# Patient Record
Sex: Female | Born: 1975 | Race: White | Hispanic: Yes | Marital: Married | Smoking: Never smoker
Health system: Southern US, Community
[De-identification: ages and names within clinical notes are randomized; demographics above are authoritative.]

## PROBLEM LIST (undated history)

## (undated) DIAGNOSIS — D649 Anemia, unspecified: Secondary | ICD-10-CM

## (undated) DIAGNOSIS — C801 Malignant (primary) neoplasm, unspecified: Secondary | ICD-10-CM

## (undated) HISTORY — PX: TUBAL LIGATION: SHX77

## (undated) HISTORY — PX: NO PAST SURGERIES: SHX2092

---

## 2004-08-19 ENCOUNTER — Emergency Department: Payer: Self-pay | Admitting: Emergency Medicine

## 2004-10-31 ENCOUNTER — Emergency Department: Payer: Self-pay | Admitting: Emergency Medicine

## 2004-12-09 ENCOUNTER — Emergency Department: Payer: Self-pay | Admitting: Emergency Medicine

## 2005-03-29 ENCOUNTER — Emergency Department: Payer: Self-pay | Admitting: Emergency Medicine

## 2005-07-15 ENCOUNTER — Emergency Department: Payer: Self-pay | Admitting: Emergency Medicine

## 2005-07-16 ENCOUNTER — Ambulatory Visit: Payer: Self-pay | Admitting: Emergency Medicine

## 2006-05-11 ENCOUNTER — Inpatient Hospital Stay: Payer: Self-pay

## 2008-02-03 ENCOUNTER — Inpatient Hospital Stay: Payer: Self-pay

## 2008-03-14 ENCOUNTER — Ambulatory Visit: Payer: Self-pay | Admitting: Obstetrics and Gynecology

## 2008-03-21 ENCOUNTER — Ambulatory Visit: Payer: Self-pay | Admitting: Obstetrics and Gynecology

## 2011-07-05 ENCOUNTER — Ambulatory Visit: Payer: Self-pay | Admitting: Obstetrics and Gynecology

## 2011-07-07 ENCOUNTER — Ambulatory Visit: Payer: Self-pay | Admitting: Obstetrics and Gynecology

## 2016-04-16 ENCOUNTER — Ambulatory Visit
Admission: EM | Admit: 2016-04-16 | Discharge: 2016-04-16 | Disposition: A | Discharge status: Home / Self Care | Payer: BLUE CROSS/BLUE SHIELD | Attending: Family Medicine | Admitting: Family Medicine

## 2016-04-16 ENCOUNTER — Encounter: Payer: Self-pay | Admitting: Emergency Medicine

## 2016-04-16 DIAGNOSIS — J020 Streptococcal pharyngitis: Secondary | ICD-10-CM

## 2016-04-16 LAB — RAPID STREP SCREEN (MED CTR MEBANE ONLY): STREPTOCOCCUS, GROUP A SCREEN (DIRECT): POSITIVE — AB

## 2016-04-16 MED ORDER — AMOXICILLIN-POT CLAVULANATE 875-125 MG PO TABS: 1.0000 | ORAL_TABLET | Freq: Two times a day (BID) | ORAL | Status: DC

## 2016-04-16 NOTE — Discharge Instructions (Signed)
Faringitis (Pharyngitis) La faringitis ocurre cuando la faringe presenta enrojecimiento, dolor e hinchazn (inflamacin).  CAUSAS  Normalmente, la faringitis se debe a una infeccin. Generalmente, estas infecciones ocurren debido a virus (viral) y se presentan cuando las personas se resfran. Sin embargo, a Curator faringitis es provocada por bacterias (bacteriana). Las alergias tambin pueden ser una causa de la faringitis. La faringitis viral se puede contagiar de Ardelia Mems persona a otra al toser, estornudar y compartir objetos o utensilios personales (tazas, tenedores, cucharas, cepillos de diente). La faringitis bacteriana se puede contagiar de Ardelia Mems persona a otra a travs de un contacto ms ntimo, como besar.  [place] sntomas de la faringitis incluyen los siguientes:   Dolor de Investment banker, operational.  Cansancio (fatiga).  Fiebre no muy elevada.  Dolor de [place].  Dolores musculares y en las articulaciones.  Erupciones cutneas  Ganglios linfticos hinchados.  Una pelcula parecida a las placas en la garganta o las amgdalas (frecuente con la faringitis bacteriana). DIAGNSTICO  El mdico le har preguntas sobre la enfermedad y sus sntomas. Normalmente, todo lo que se necesita para diagnosticar una faringitis son sus antecedentes mdicos y un examen fsico. A veces se realiza una prueba rpida para estreptococos. Tambin es posible que se realicen otros anlisis de laboratorio, segn la posible causa.  TRATAMIENTO  La faringitis viral normalmente mejorar en un plazo de 3 a 4das sin medicamentos. La faringitis bacteriana se trata con medicamentos que [employer] grmenes (antibiticos).  INSTRUCCIONES PARA EL CUIDADO EN EL HOGAR   Beba gran cantidad de lquido para mantener la orina de tono claro o color amarillo plido.  Tome solo medicamentos de venta libre o recetados, segn las indicaciones del mdico.  Si le receta antibiticos, asegrese de terminarlos, incluso si comienza  a Sports administrator.  No tome aspirina.  Descanse lo suficiente.  Hgase grgaras con 8onzas (287m) de agua con sal (cucharadita de sal por litro de agua) cada 1 o 2horas para cEngineer, structural  Puede usar pastillas (si no corre riesgo de aHydrologist o aerosoles para cEngineer, structural SOLICITE ATENCIN MDICA SI:   Tiene bultos grandes y dolorosos en el cuello.  Tiene una erupcin cutnea.  Cuando tose elimina una expectoracin verde, amarillo amarronado o con [place] SOLICITE ATENCIN MDICA DE INMEDIATO SI:   El cuello se pone rgido.  Comienza a babear o no puede tragar lquidos.  Vomita o no puede retener los mCMS Energy Corporationlquidos.  Siente un dolor intenso que no se alivia con los medicamentos recomendados.  Tiene dificultades para respirar (y no debido a la nariz tapada). ASEGRESE DE QUE:   Comprende estas instrucciones.  Controlar su afeccin.  Recibir ayuda de inmediato si no mejora o si empeora.   Esta informacin no tiene cMarine scientistel consejo del mdico. Asegrese de hacerle al mdico cualquier pregunta que tenga.   Document Released: 07/06/2005 Document Revised: 07/17/2013 Elsevier Interactive Patient Education 2016 EJunction Cityrpida para estreptococos (Rapid Strep Test) La faringitis estreptoccica es una infeccin bacteriana causada por la especie de bacterias Streptococcus pyogenes. La prueba rpida para estreptococos es la forma ms veloz de comprobar si estas bacterias son las causantes del dolor de gInvestment banker, operational La prueba puede realizarse en el consultorio del mdico. GM[place] los resultados estn listos en el trmino de 10 a 230mutos. Pueden hacerle este estudio si tiene sntomas de faPrint production plannerEstos incluyen los siguientes:   Garganta roja con manchas amarillas o blancas.  Hinchazn y doSocial research officer, governmente  cuello.  Cristy Hilts.  Prdida del apetito.  Problemas para respirar o  tragar.  Erupcin.  Deshidratacin. Esta prueba requiere que se tome una muestra de las secreciones de la parte posterior de la garganta y las [place]. El mdico puede bajarle la lengua con un bajalenguas y usar un hisopo para tomar la [place],  y, al [employer], tomar una segunda muestra que puede utilizarse para un cultivo de las secreciones de la garganta. En un cultivo, la [place] se [place] con una sustancia que promueve el crecimiento de las bacterias. Toma ms tiempo [employer] del cultivo de las secreciones de la garganta, [place] son ms exactos y [employer] de la prueba rpida para estreptococos o Soil scientist que esos resultados estaban equivocados. RESULTADOS  Es su responsabilidad retirar el resultado del [place]. Consulte en el laboratorio o en el departamento en el que fue realizado el estudio cundo y cmo podr [employer]. Comunquese con el mdico si tiene [employer].  Los resultados de la prueba rpida para estreptococos sern negativos o positivos.  Significado de los [employer] del anlisis Si el resultado de la prueba rpida para estreptococos es negativo, significa que:   Es probable que no Librarian, academic.  Es posible que un virus est causando el dolor de Investment banker, operational. El mdico puede hacer un cultivo de las secreciones de la garganta para confirmar los [place] de la prueba rpida para estreptococos. Este cultivo tambin puede identificar las diferentes cepas de las bacterias estreptoccicas. Significado de [employer] positivos del anlisis Si el resultado de la prueba rpida para estreptococos es positivo, significa que:  Es probable que Librarian, academic.  Tal vez tenga que tomar antibiticos. El mdico puede hacer un cultivo de las secreciones de la garganta para confirmar los [place] de la prueba rpida para estreptococos. Generalmente, la faringitis estreptoccica  requiere un tratamiento con antibiticos.    Esta informacin no tiene Marine scientist el consejo del mdico. Asegrese de hacerle al mdico cualquier pregunta que tenga.   Document Released: 09/26/2005 Document Revised: 10/17/2014 Elsevier Interactive Patient Education [employer].

## 2016-04-16 NOTE — ED Provider Notes (Signed)
CSN: [id number]     Arrival date & time 04/16/16  3149 History   First MD Initiated Contact with Patient 04/16/16 1016     Chief Complaint  Patient presents with  . Sore Throat   (Consider location/radiation/quality/duration/timing/severity/associated sxs/prior Treatment) HPI  This a 40 year old Hispanic female presents with a 2 day history of severe sore throat and ear pain. She's not had any sick contacts to her knowledge. She is a stay-at-home mom. She's had no fever or chills but has complained of hot and cold spells.    History reviewed. No pertinent past medical history. Past Surgical History  Procedure Laterality Date  . No past surgeries     No family history on file. Social History  Substance Use Topics  . Smoking status: Never Smoker   . Smokeless tobacco: Never Used  . Alcohol Use: No   OB History    No data available     Review of Systems  Constitutional: Positive for fever and chills. Negative for activity change, appetite change and fatigue.  HENT: Positive for ear pain and sore throat.   All other systems reviewed and are negative.   Allergies  Review of patient's allergies indicates no known allergies.  Home Medications   Prior to Admission medications   Medication Sig Start Date End Date Taking? Authorizing Provider  amoxicillin-clavulanate (AUGMENTIN) 875-125 MG tablet Take 1 tablet by mouth every 12 (twelve) hours. 04/16/16   Lorin Picket, PA-C   Meds Ordered and Administered this Visit  Medications - No data to display  BP 102/69 mmHg  Pulse 83  Temp(Src) 98.4 F (36.9 C)  Resp 16  Ht 5' 1"  (1.549 m)  Wt 145 lb (65.772 kg)  BMI 27.41 kg/m2  SpO2 99%  LMP 04/08/2016 No data found.   Physical Exam  Constitutional: She is oriented to person, place, and time. She appears well-developed and well-nourished. No distress.  HENT:  Head: Normocephalic and atraumatic.  Right Ear: External ear normal.  Left Ear: External ear normal.   Examination the oropharynx shows to be very erythematous with swollen tonsils more prevalent on the right. Is a cobblestone appearance with slight coating of exudate. Additionally she has shotty nodes in anterior cervical chain bilaterally  Eyes: Conjunctivae are normal. Pupils are equal, round, and reactive to light.  Neck: Normal range of motion. Neck supple.  Pulmonary/Chest: No stridor.  Musculoskeletal: Normal range of motion. She exhibits no edema or tenderness.  Lymphadenopathy:    She has cervical adenopathy.  Neurological: She is alert and oriented to person, place, and time.  Skin: Skin is warm and dry. She is not diaphoretic.  Psychiatric: She has a normal mood and affect. Her behavior is normal. Judgment and thought content normal.  Nursing note and vitals reviewed.   ED Course  Procedures (including critical care time)  Labs Review Labs Reviewed  RAPID STREP SCREEN (NOT AT [facility]) - Abnormal; Notable for the following:    Streptococcus, Group A Screen (Direct) POSITIVE (*)    All other components within normal limits    Imaging Review No results found.   Visual Acuity Review  Right Eye Distance:   Left Eye Distance:   Bilateral Distance:    Right Eye Near:   Left Eye Near:    Bilateral Near:         MDM   1. Streptococcal pharyngitis    New Prescriptions   AMOXICILLIN-CLAVULANATE (AUGMENTIN) 875-125 MG TABLET    Take 1 tablet by mouth  every 12 (twelve) hours.  Plan: 1. Test/x-ray results and diagnosis reviewed with patient 2. rx as per orders; risks, benefits, potential side effects reviewed with patient 3. Recommend supportive treatment with salt Water gargles for comfort. She may also use lozenges or cough drops. She refused a injection for the strep throat and preferred to take oral medications. She should follow-up with her primary care if she is not improving in several days  4. F/u prn if symptoms worsen or don't improve     Lorin Picket,  PA-C 04/16/16 1049

## 2016-04-16 NOTE — ED Notes (Signed)
Sore throat and ear pain for 2 days

## 2017-11-22 ENCOUNTER — Other Ambulatory Visit: Payer: Self-pay | Admitting: Obstetrics & Gynecology

## 2017-11-22 DIAGNOSIS — Z1239 Encounter for other screening for malignant neoplasm of breast: Secondary | ICD-10-CM

## 2017-11-27 ENCOUNTER — Ambulatory Visit
Admission: RE | Admit: 2017-11-27 | Discharge: 2017-11-27 | Disposition: A | Discharge status: Home / Self Care | Payer: BLUE CROSS/BLUE SHIELD | Source: Ambulatory Visit | Attending: Obstetrics & Gynecology | Admitting: Obstetrics & Gynecology

## 2017-11-27 ENCOUNTER — Encounter: Payer: Self-pay | Admitting: Radiology

## 2017-11-27 DIAGNOSIS — Z1239 Encounter for other screening for malignant neoplasm of breast: Secondary | ICD-10-CM

## 2017-11-27 DIAGNOSIS — Z1231 Encounter for screening mammogram for malignant neoplasm of breast: Secondary | ICD-10-CM | POA: Insufficient documentation

## 2017-11-27 HISTORY — DX: Malignant (primary) neoplasm, unspecified: C80.1

## 2017-12-01 ENCOUNTER — Other Ambulatory Visit: Payer: Self-pay | Admitting: Obstetrics & Gynecology

## 2017-12-01 DIAGNOSIS — R928 Other abnormal and inconclusive findings on diagnostic imaging of breast: Secondary | ICD-10-CM

## 2017-12-01 DIAGNOSIS — N6489 Other specified disorders of breast: Secondary | ICD-10-CM

## 2017-12-11 ENCOUNTER — Ambulatory Visit
Admission: RE | Admit: 2017-12-11 | Discharge: 2017-12-11 | Disposition: A | Discharge status: Home / Self Care | Payer: BLUE CROSS/BLUE SHIELD | Source: Ambulatory Visit | Attending: Obstetrics & Gynecology | Admitting: Obstetrics & Gynecology

## 2017-12-11 DIAGNOSIS — N6489 Other specified disorders of breast: Secondary | ICD-10-CM

## 2017-12-11 DIAGNOSIS — R928 Other abnormal and inconclusive findings on diagnostic imaging of breast: Secondary | ICD-10-CM | POA: Diagnosis present

## 2018-03-24 ENCOUNTER — Other Ambulatory Visit: Payer: Self-pay

## 2018-03-24 ENCOUNTER — Ambulatory Visit (INDEPENDENT_AMBULATORY_CARE_PROVIDER_SITE_OTHER): Payer: BLUE CROSS/BLUE SHIELD

## 2018-03-24 ENCOUNTER — Ambulatory Visit
Admission: EM | Admit: 2018-03-24 | Discharge: 2018-03-24 | Disposition: A | Discharge status: Home / Self Care | Payer: BLUE CROSS/BLUE SHIELD | Attending: Family Medicine | Admitting: Family Medicine

## 2018-03-24 DIAGNOSIS — M79671 Pain in right foot: Secondary | ICD-10-CM

## 2018-03-24 DIAGNOSIS — W208XXA Other cause of strike by thrown, projected or falling object, initial encounter: Secondary | ICD-10-CM

## 2018-03-24 DIAGNOSIS — S9031XA Contusion of right foot, initial encounter: Secondary | ICD-10-CM

## 2018-03-24 MED ORDER — MELOXICAM 15 MG PO TABS: 15.0000 mg | ORAL_TABLET | Freq: Every day | ORAL | 0 refills | Status: DC | PRN

## 2018-03-24 NOTE — ED Triage Notes (Signed)
Pt reports an iron fell on top of her foot on Thursday. Bruising and swelling to top of foot. Pain 10/10

## 2018-03-24 NOTE — ED Notes (Signed)
Post op shoe applied

## 2018-03-24 NOTE — ED Provider Notes (Addendum)
[facility]    CSN: [id number] Arrival date & time: 03/24/18  0803   History   Chief Complaint Chief Complaint  Patient presents with  . Foot Injury   HPI  42 year old female presents with a foot injury.  Patient reports that an iron fell on her right foot on Thursday.  He fell directly on the dorsum of her right foot.  She reports of bruising and swelling on the dorsum of the foot.  Pain is sharp and severe.  Worse with activity and pressure.  No relieving factors.  No reports of ankle injury.  No other reported symptoms.  No other complaints at this time.  Past Medical History:  Diagnosis Date  . Cancer ([place])    skin ca   Past Surgical History:  Procedure Laterality Date  . NO PAST SURGERIES      OB History   None    Family History Family History  Problem Relation Age of Onset  . Breast cancer Neg Hx    Social History Social History   Tobacco Use  . Smoking status: Never Smoker  . Smokeless tobacco: Never Used  Substance Use Topics  . Alcohol use: No  . Drug use: No   Allergies   Patient has no known allergies.  Review of Systems Review of Systems  Constitutional: Negative.   Musculoskeletal:       Foot injury, bruising, swelling.   Physical Exam Triage Vital Signs ED Triage Vitals [03/24/18 0820]  Enc Vitals Group     BP 98/73     Pulse Rate 67     Resp 16     Temp 98 F (36.7 C)     Temp Source Oral     SpO2 100 %     Weight 152 lb (68.9 kg)     Height      Head Circumference      Peak Flow      Pain Score 10     Pain Loc      Pain Edu?      Excl. in [place]?    Updated Vital Signs BP 98/73 (BP Location: Left Arm)   Pulse 67   Temp 98 F (36.7 C) (Oral)   Resp 16   Wt 152 lb (68.9 kg)   LMP 03/03/2018   SpO2 100%   BMI 28.72 kg/m     Physical Exam  Constitutional: She is oriented to person, place, and time. She appears well-developed. No distress.  HENT:  Head: Normocephalic and atraumatic.  Nose: Nose normal.    Cardiovascular: Normal rate and regular rhythm.  No murmur heard. Pulmonary/Chest: Effort normal and breath sounds normal. She has no rales.  Musculoskeletal:       Feet:  Right foot -dorsum of the foot at the labeled location with swelling, bruising, and exquisite tenderness to palpation.  Normal range of motion of the ankle.  No discrete area of tenderness of the ankle.  Neurological: She is alert and oriented to person, place, and time.  Psychiatric: She has a normal mood and affect. Her behavior is normal.  Nursing note and vitals reviewed.  UC Treatments / Results  Labs (all labs ordered are listed, but only abnormal results are displayed) Labs Reviewed - No data to display  EKG None  Radiology Dg Foot Complete Right  Result Date: 03/24/2018 CLINICAL DATA:  Pain, swelling, bruising on top of foot EXAM: RIGHT FOOT COMPLETE - 3+ VIEW COMPARISON:  None. FINDINGS: Soft tissue swelling  along the dorsum of the foot overlying the metatarsals. No fracture, subluxation or dislocation. Joint spaces maintained. IMPRESSION: No acute bony abnormality. Electronically Signed   By: Rolm Baptise M.D.   On: 03/24/2018 08:40    Procedures Procedures (including critical care time)  Medications Ordered in UC Medications - No data to display  Initial Impression / Assessment and Plan / UC Course  I have reviewed the triage vital signs and the nursing notes.  Pertinent labs & imaging results that were available during my care of the patient were reviewed by me and considered in my medical decision making (see chart for details).    42 year old female presents with a foot injury.  X-ray was negative.  Patient has a contusion and subsequent swelling on the dorsum of her foot.  Advised rest, ice, elevation.  Meloxicam as needed.  Placed in postop shoe for comfort.  Final Clinical Impressions(s) / UC Diagnoses   Final diagnoses:  Contusion of right foot, initial encounter     Discharge  Instructions     Xray was negative.  Rest, ice.  Medication as prescribed.  Take care  Dr. Lacinda Axon     ED Prescriptions    Medication Sig Dispense Auth. Provider   meloxicam (MOBIC) 15 MG tablet Take 1 tablet (15 mg total) by mouth daily as needed. 30 tablet Coral Spikes, DO     Controlled Substance Prescriptions [place] Controlled Substance Registry consulted? Not Applicable   Coral Spikes, DO 03/24/18 [place], [place], DO 03/24/18 [phone]

## 2018-03-24 NOTE — Discharge Instructions (Addendum)
Xray was negative.  Rest, ice.  Medication as prescribed.  Take care  Dr. Lacinda Axon

## 2019-04-24 ENCOUNTER — Other Ambulatory Visit: Payer: Self-pay | Admitting: Neurology

## 2019-04-24 DIAGNOSIS — M545 Low back pain, unspecified: Secondary | ICD-10-CM

## 2019-04-24 DIAGNOSIS — G8929 Other chronic pain: Secondary | ICD-10-CM

## 2019-05-07 ENCOUNTER — Other Ambulatory Visit: Payer: Self-pay

## 2019-05-07 ENCOUNTER — Ambulatory Visit
Admission: RE | Admit: 2019-05-07 | Discharge: 2019-05-07 | Disposition: A | Discharge status: Home / Self Care | Payer: BC Managed Care – PPO | Source: Ambulatory Visit | Attending: Neurology | Admitting: Neurology

## 2019-05-07 DIAGNOSIS — M545 Low back pain, unspecified: Secondary | ICD-10-CM

## 2019-05-07 DIAGNOSIS — G8929 Other chronic pain: Secondary | ICD-10-CM | POA: Diagnosis present

## 2019-09-13 IMAGING — MR MRI LUMBAR SPINE WITHOUT CONTRAST
5 series · 31 of 48 positions shown · non-contrast
Comparison: None.

CLINICAL DATA: Low back pain 5 years.

EXAM:
MRI LUMBAR SPINE WITHOUT CONTRAST
TECHNIQUE: Multiplanar, multisequence MR imaging of the lumbar spine was
performed. No intravenous contrast was administered.

[Series 5: T2 · sagittal · 4.0mm · 0.81mm/px · 6 of 17 slices shown (1 of 2)]
[im 1/17]
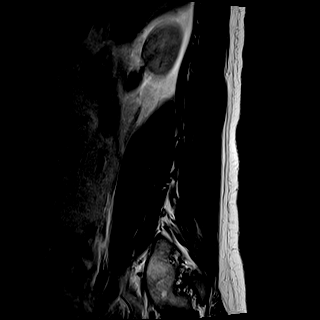
[im 4/17]
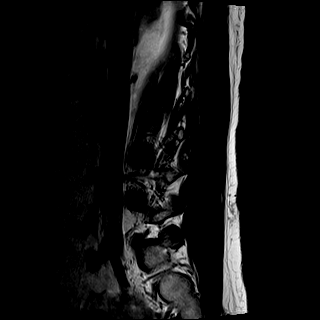
[im 7/17]
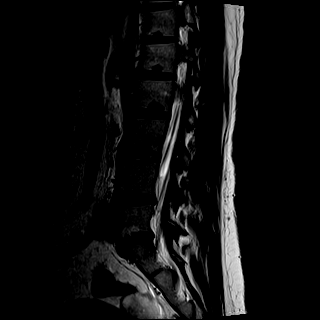
[im 10/17]
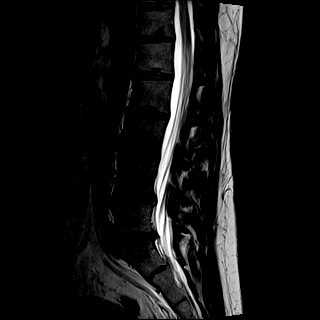
[im 13/17]
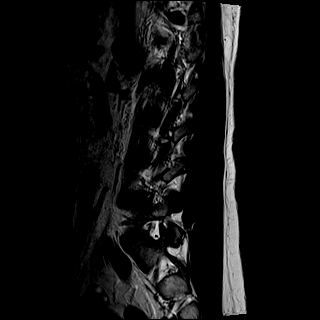
[im 17/17]
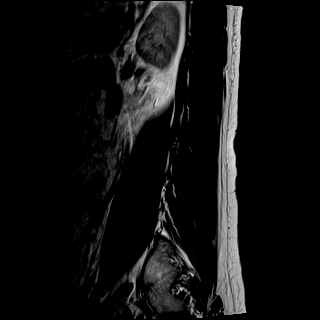

[Series 6: T1 · sagittal · 4.0mm · 0.81mm/px · 7 of 17 slices shown (1 of 2)]
[im 1/17]
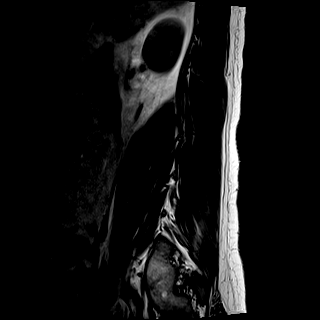
[im 3/17]
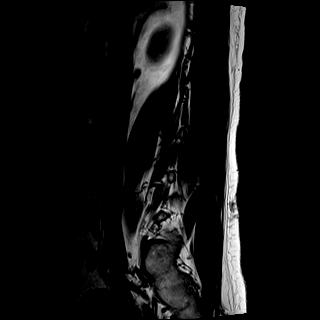
[im 6/17]
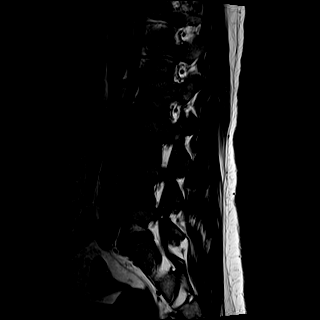
[im 9/17]
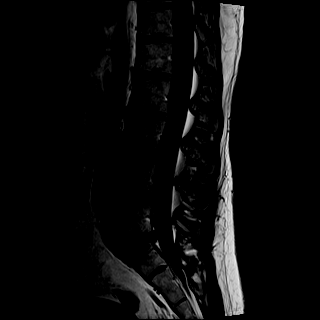
[im 11/17]
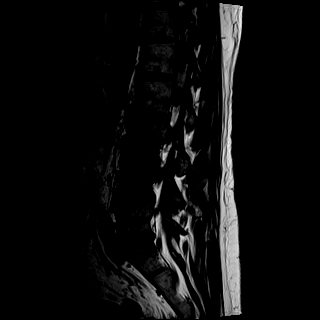
[im 14/17]
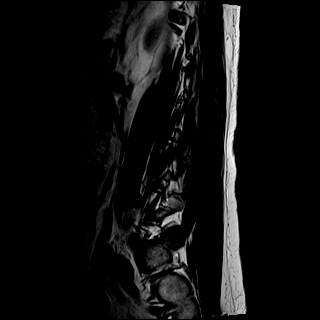
[im 17/17]
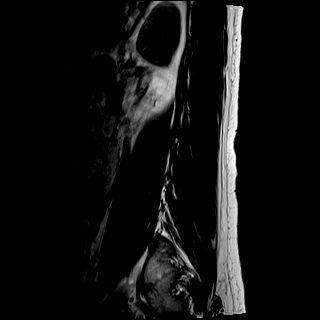

[Series 7: STIR · sagittal · 4.0mm · 0.41mm/px · 2 of 17 slices shown]
[im 1/17]
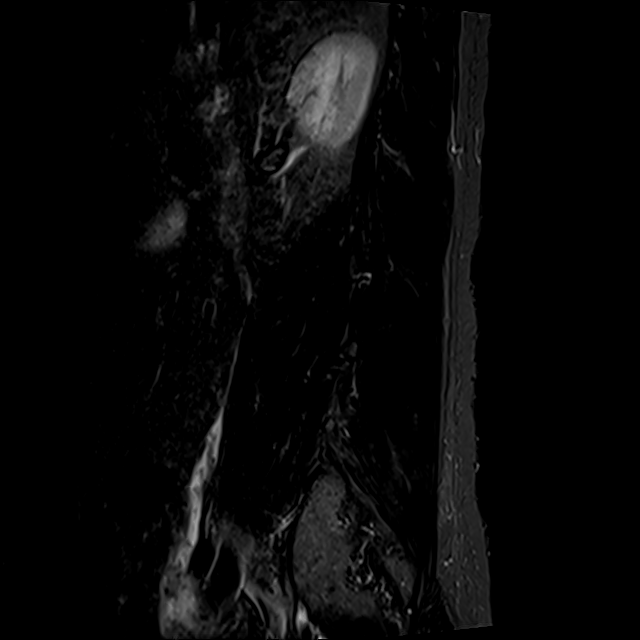
[im 3/17]
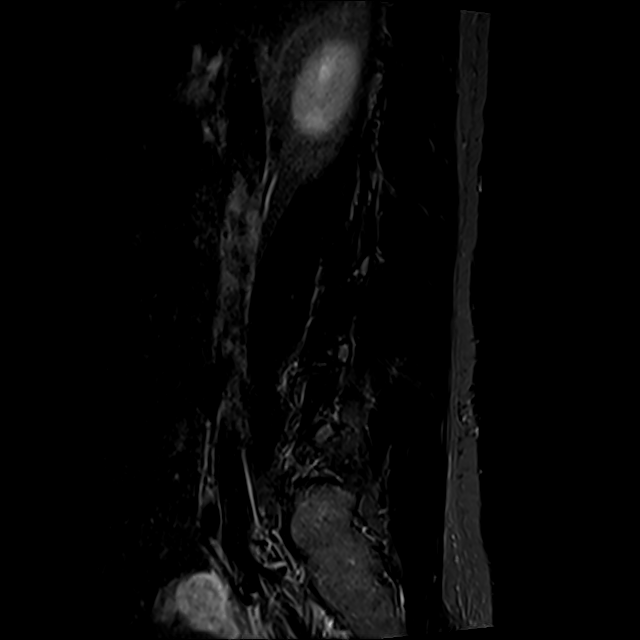

[Series 8: T2 · axial · 4.0mm · 0.78mm/px · z∈[-73,+122]mm · 8 of 34 slices shown (2 of 2)]
[im 1/34]
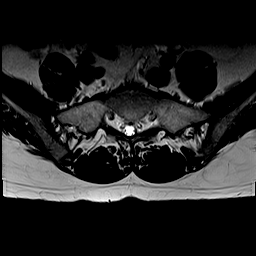
[im 6/34]
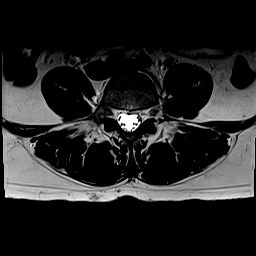
[im 11/34]
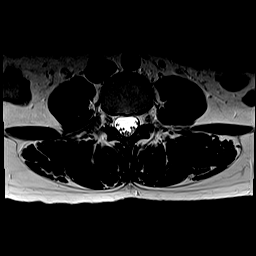
[im 16/34]
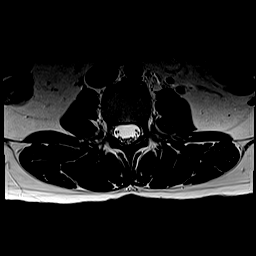
[im 18/34]
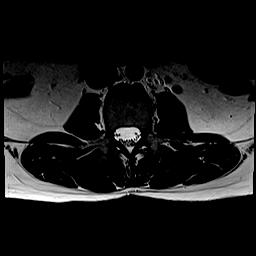
[im 23/34]
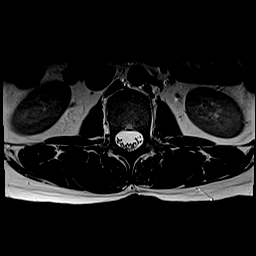
[im 28/34]
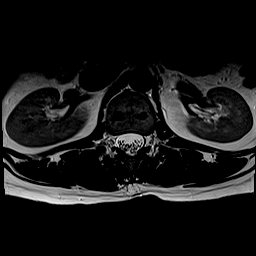
[im 34/34]
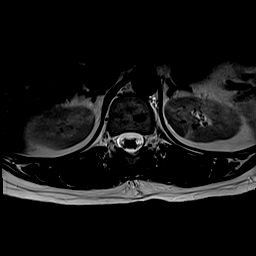

[Series 9: T1 · axial · 4.0mm · 0.39mm/px · z∈[-73,+122]mm · 8 of 34 slices shown (2 of 2)]
[im 1/34]
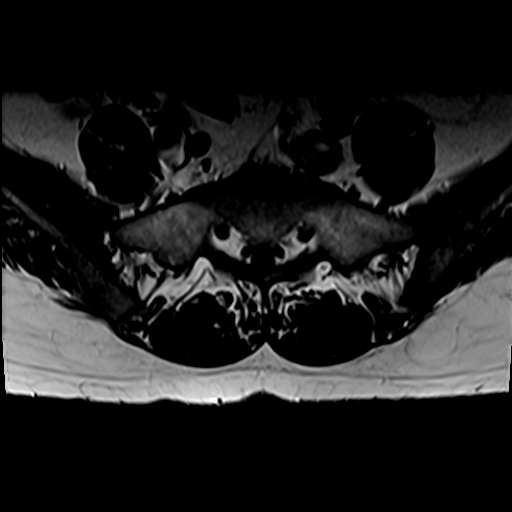
[im 6/34]
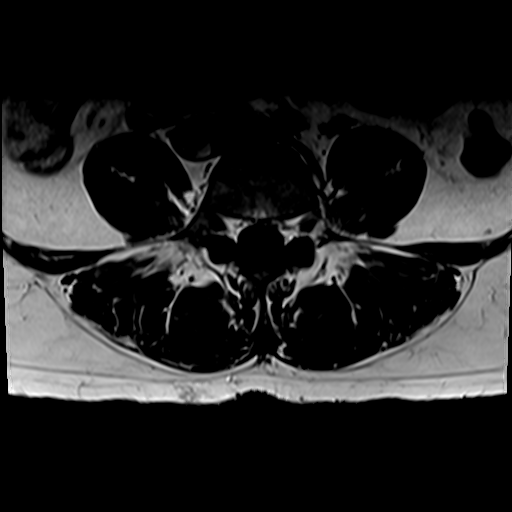
[im 11/34]
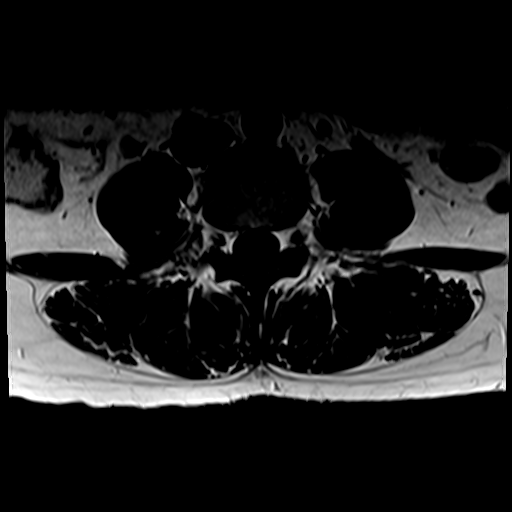
[im 16/34]
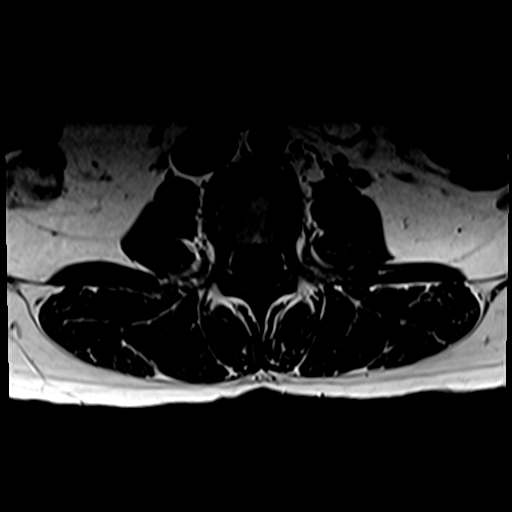
[im 18/34]
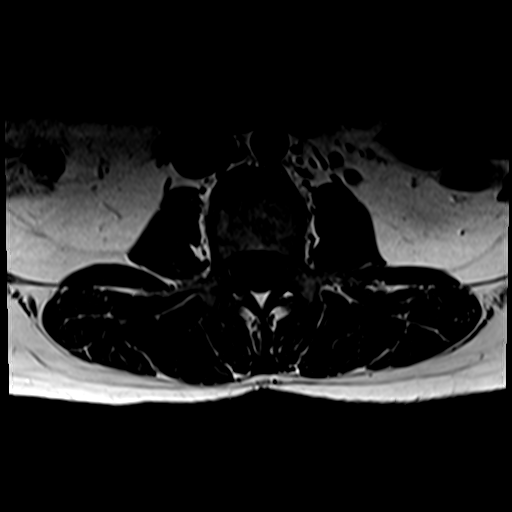
[im 23/34]
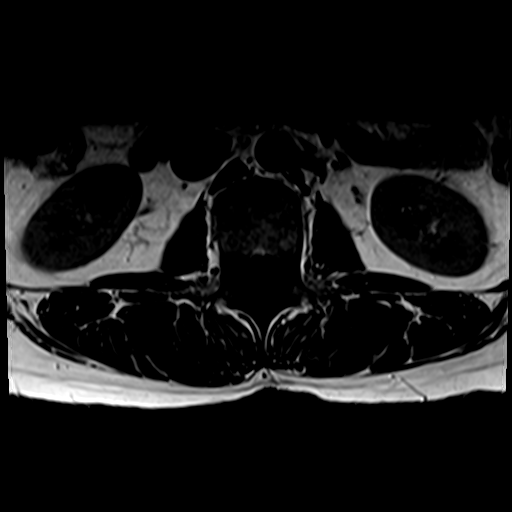
[im 28/34]
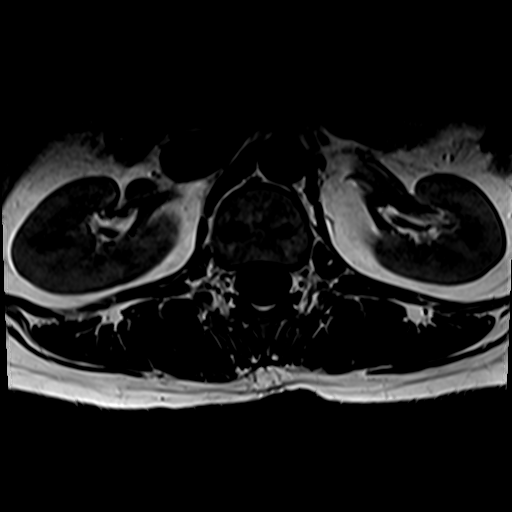
[im 34/34]
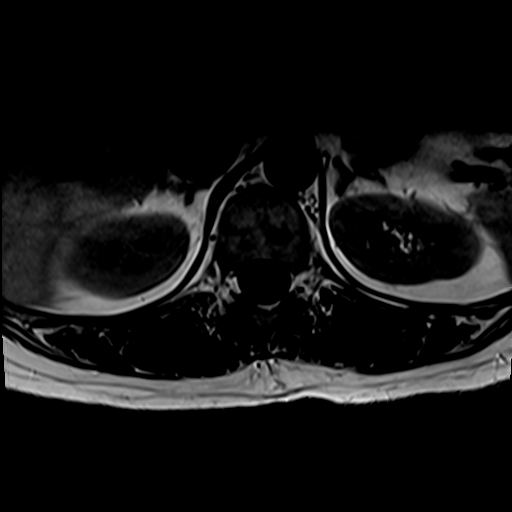

[31 of 48 positions shown; findings below may reference images not displayed]

FINDINGS: Segmentation: There are five lumbar type vertebral bodies. The last
full intervertebral disc space is labeled L5-S1.

Alignment:  Normal

Vertebrae:  Normal marrow signal.  No bone lesions or fractures.

Conus medullaris and cauda equina: Conus extends to the L1 level.
Conus and cauda equina appear normal.

Paraspinal and other soft tissues: No significant paraspinal or
retroperitoneal findings.

Disc levels:

L1-2: No significant findings.

L2-3: No significant findings.

L3-4: No significant findings.

L4-5: Mild disc desiccation. There is a shallow central disc
protrusion with mild mass effect on the ventral thecal sac. Mild
lateral recess encroachment also. There is also an annular fissure
and shallow extraforaminal disc protrusion potentially irritating
the extraforaminal left L4 nerve root.

L5-S1: Small central disc protrusion barely contacting the ventral
thecal sac but there is potential irritation of the S1 nerve roots.
The exiting L5 nerve roots are normal.
IMPRESSION: Disc protrusions at L4-5 and L5-S1 as detailed above.

## 2019-10-07 ENCOUNTER — Other Ambulatory Visit: Payer: Self-pay | Admitting: Obstetrics & Gynecology

## 2019-10-07 DIAGNOSIS — N921 Excessive and frequent menstruation with irregular cycle: Secondary | ICD-10-CM

## 2019-10-09 ENCOUNTER — Other Ambulatory Visit: Payer: Self-pay | Admitting: Obstetrics & Gynecology

## 2019-10-09 DIAGNOSIS — N921 Excessive and frequent menstruation with irregular cycle: Secondary | ICD-10-CM

## 2019-10-14 ENCOUNTER — Ambulatory Visit: Payer: BC Managed Care – PPO

## 2019-10-17 ENCOUNTER — Other Ambulatory Visit: Payer: Self-pay

## 2019-10-17 ENCOUNTER — Ambulatory Visit
Admission: RE | Admit: 2019-10-17 | Discharge: 2019-10-17 | Disposition: A | Discharge status: Home / Self Care | Payer: BC Managed Care – PPO | Source: Ambulatory Visit | Attending: Obstetrics & Gynecology | Admitting: Obstetrics & Gynecology

## 2019-10-17 DIAGNOSIS — N921 Excessive and frequent menstruation with irregular cycle: Secondary | ICD-10-CM | POA: Diagnosis present

## 2019-12-11 ENCOUNTER — Other Ambulatory Visit: Payer: Self-pay | Admitting: Obstetrics & Gynecology

## 2019-12-18 ENCOUNTER — Encounter
Admission: RE | Admit: 2019-12-18 | Discharge: 2019-12-18 | Disposition: A | Discharge status: Home / Self Care | Payer: BC Managed Care – PPO | Source: Ambulatory Visit | Attending: Obstetrics & Gynecology | Admitting: Obstetrics & Gynecology

## 2019-12-18 ENCOUNTER — Other Ambulatory Visit: Payer: Self-pay

## 2019-12-18 HISTORY — DX: Anemia, unspecified: D64.9

## 2019-12-18 NOTE — Patient Instructions (Signed)
Your procedure is scheduled on: 12/23/19 Report to [facility]. To find out your arrival time please call [phone] between 1PM - 3PM on 12/22/19.  Remember: Instructions that are not followed completely may result in serious medical risk, up to and including death, or upon the discretion of your surgeon and anesthesiologist your surgery may need to be rescheduled.     _X__ 1. Do not eat food after midnight the night before your procedure.                 No gum chewing or hard candies. You may drink clear liquids up to 2 hours                 before you are scheduled to arrive for your surgery- DO not drink clear                 liquids within 2 hours of the start of your surgery.                 Clear Liquids include:  water, apple juice without pulp, clear carbohydrate                 drink such as Clearfast or Gatorade, Black Coffee or Tea (Do not add                 anything to coffee or tea). Diabetics water only  __X__2.  On the morning of surgery brush your teeth with toothpaste and water, you                 may rinse your mouth with mouthwash if you wish.  Do not swallow any              toothpaste of mouthwash.     _X__ 3.  No Alcohol for 24 hours before or after surgery.   _X__ 4.  Do Not Smoke or use e-cigarettes For 24 Hours Prior to Your Surgery.                 Do not use any chewable tobacco products for at least 6 hours prior to                 surgery.  ____  5.  Bring all medications with you on the day of surgery if instructed.   __X__  6.  Notify your doctor if there is any change in your medical condition      (cold, fever, infections).     Do not wear jewelry, make-up, hairpins, clips or nail polish. Do not wear lotions, powders, or perfumes.  Do not shave 48 hours prior to surgery. Men may shave face and neck. Do not bring valuables to the hospital.    [facility] is not responsible for any belongings or  valuables.  Contacts, dentures/partials or body piercings may not be worn into surgery. Bring a case for your contacts, glasses or hearing aids, a denture cup will be supplied. Leave your suitcase in the car. After surgery it may be brought to your room. For patients admitted to the hospital, discharge time is determined by your treatment team.   Patients discharged the day of surgery will not be allowed to drive home.   Please read over the following fact sheets that you were given:   MRSA Information  __X__ Take these medicines the morning of surgery with A SIP OF WATER:  1. none  2.   3.   4.  5.  6.  ____ Fleet Enema (as directed)   ____ Use CHG Soap/SAGE wipes as directed  ____ Use inhalers on the day of surgery  ____ Stop metformin/Janumet/Farxiga 2 days prior to surgery    ____ Take 1/2 of usual insulin dose the night before surgery. No insulin the morning          of surgery.   ____ Stop Blood Thinners Coumadin/Plavix/Xarelto/Pleta/Pradaxa/Eliquis/Effient/Aspirin  on   Or contact your Surgeon, Cardiologist or Medical Doctor regarding  ability to stop your blood thinners  __X__ Stop Anti-inflammatories 7 days before surgery such as Advil, Ibuprofen, Motrin,  BC or Goodies Powder, Naprosyn, Naproxen, Aleve, Aspirin   May take tylenol if needed  __X__ Stop all herbal supplements, fish oil or vitamin E until after surgery.    ____ Bring C-Pap to the hospital.

## 2019-12-19 ENCOUNTER — Other Ambulatory Visit
Admission: RE | Admit: 2019-12-19 | Discharge: 2019-12-19 | Disposition: A | Discharge status: Home / Self Care | Payer: BC Managed Care – PPO | Source: Ambulatory Visit | Attending: Obstetrics & Gynecology | Admitting: Obstetrics & Gynecology

## 2019-12-19 DIAGNOSIS — Z01812 Encounter for preprocedural laboratory examination: Secondary | ICD-10-CM | POA: Diagnosis present

## 2019-12-19 DIAGNOSIS — Z20822 Contact with and (suspected) exposure to covid-19: Secondary | ICD-10-CM | POA: Insufficient documentation

## 2019-12-19 LAB — CBC
HCT: 34 % — ABNORMAL LOW (ref 36.0–46.0)
Hemoglobin: 10.1 g/dL — ABNORMAL LOW (ref 12.0–15.0)
MCH: 23.5 pg — ABNORMAL LOW (ref 26.0–34.0)
MCHC: 29.7 g/dL — ABNORMAL LOW (ref 30.0–36.0)
MCV: 79.1 fL — ABNORMAL LOW (ref 80.0–100.0)
Platelets: 371 10*3/uL (ref 150–400)
RBC: 4.3 MIL/uL (ref 3.87–5.11)
RDW: 14.2 % (ref 11.5–15.5)
WBC: 5.7 10*3/uL (ref 4.0–10.5)
nRBC: 0 % (ref 0.0–0.2)

## 2019-12-19 LAB — TYPE AND SCREEN
ABO/RH(D): A POS
Antibody Screen: NEGATIVE

## 2019-12-19 LAB — BASIC METABOLIC PANEL
Anion gap: 7 (ref 5–15)
BUN: 20 mg/dL (ref 6–20)
CO2: 25 mmol/L (ref 22–32)
Calcium: 8.4 mg/dL — ABNORMAL LOW (ref 8.9–10.3)
Chloride: 103 mmol/L (ref 98–111)
Creatinine, Ser: 0.82 mg/dL (ref 0.44–1.00)
GFR calc Af Amer: >60 mL/min (ref >60)
GFR calc non Af Amer: >60 mL/min (ref >60)
Glucose, Bld: 97 mg/dL (ref 70–99)
Potassium: 3.9 mmol/L (ref 3.5–5.1)
Sodium: 135 mmol/L (ref 135–145)

## 2019-12-19 LAB — SARS CORONAVIRUS 2 (TAT 6-24 HRS): SARS Coronavirus 2: NEGATIVE

## 2019-12-23 ENCOUNTER — Ambulatory Visit
Admission: RE | Admit: 2019-12-23 | Discharge: 2019-12-23 | Disposition: A | Discharge status: Home / Self Care | Payer: BC Managed Care – PPO | Source: Ambulatory Visit | Attending: Obstetrics & Gynecology | Admitting: Obstetrics & Gynecology

## 2019-12-23 ENCOUNTER — Encounter: Payer: Self-pay | Admitting: Obstetrics & Gynecology

## 2019-12-23 ENCOUNTER — Encounter
Admission: RE | Disposition: A | Discharge status: Home / Self Care | Payer: Self-pay | Source: Ambulatory Visit | Attending: Obstetrics & Gynecology

## 2019-12-23 ENCOUNTER — Other Ambulatory Visit: Payer: Self-pay

## 2019-12-23 ENCOUNTER — Ambulatory Visit: Payer: BC Managed Care – PPO | Admitting: Anesthesiology

## 2019-12-23 DIAGNOSIS — N920 Excessive and frequent menstruation with regular cycle: Secondary | ICD-10-CM | POA: Insufficient documentation

## 2019-12-23 DIAGNOSIS — N840 Polyp of corpus uteri: Secondary | ICD-10-CM | POA: Diagnosis not present

## 2019-12-23 DIAGNOSIS — Z85828 Personal history of other malignant neoplasm of skin: Secondary | ICD-10-CM | POA: Insufficient documentation

## 2019-12-23 HISTORY — PX: DILATATION & CURETTAGE/HYSTEROSCOPY WITH MYOSURE: SHX6511

## 2019-12-23 LAB — ABO/RH: ABO/RH(D): A POS

## 2019-12-23 LAB — POCT PREGNANCY, URINE: Preg Test, Ur: NEGATIVE

## 2019-12-23 SURGERY — DILATATION & CURETTAGE/HYSTEROSCOPY WITH MYOSURE
Anesthesia: General

## 2019-12-23 MED ORDER — ACETAMINOPHEN 500 MG PO TABS: 1000.0000 mg | ORAL_TABLET | Freq: Four times a day (QID) | ORAL | Status: DC

## 2019-12-23 MED ORDER — PROPOFOL 10 MG/ML IV BOLUS
INTRAVENOUS | Status: DC | PRN
  Administered 2019-12-23: 150 mg via INTRAVENOUS

## 2019-12-23 MED ORDER — MIDAZOLAM HCL 2 MG/2ML IJ SOLN
INTRAMUSCULAR | Status: AC
  Filled 2019-12-23: qty 2

## 2019-12-23 MED ORDER — LACTATED RINGERS IV SOLN: INTRAVENOUS | Status: DC

## 2019-12-23 MED ORDER — FAMOTIDINE 20 MG PO TABS: 20.0000 mg | ORAL_TABLET | Freq: Once | ORAL | Status: AC

## 2019-12-23 MED ORDER — KETOROLAC TROMETHAMINE 30 MG/ML IJ SOLN: 30.0000 mg | Freq: Four times a day (QID) | INTRAMUSCULAR | Status: DC

## 2019-12-23 MED ORDER — FENTANYL CITRATE (PF) 100 MCG/2ML IJ SOLN
INTRAMUSCULAR | Status: DC | PRN
  Administered 2019-12-23 (×2): 25 ug via INTRAVENOUS
  Administered 2019-12-23: 50 ug via INTRAVENOUS

## 2019-12-23 MED ORDER — PHENYLEPHRINE HCL (PRESSORS) 10 MG/ML IV SOLN
INTRAVENOUS | Status: DC | PRN
  Administered 2019-12-23: 100 ug via INTRAVENOUS

## 2019-12-23 MED ORDER — FENTANYL CITRATE (PF) 100 MCG/2ML IJ SOLN: 25.0000 ug | INTRAMUSCULAR | Status: DC | PRN

## 2019-12-23 MED ORDER — LACTATED RINGERS IV SOLN: INTRAVENOUS | Status: DC | PRN

## 2019-12-23 MED ORDER — FENTANYL CITRATE (PF) 100 MCG/2ML IJ SOLN
25.0000 ug | INTRAMUSCULAR | Status: DC | PRN
  Administered 2019-12-23 (×2): 25 ug via INTRAVENOUS

## 2019-12-23 MED ORDER — FENTANYL CITRATE (PF) 100 MCG/2ML IJ SOLN
INTRAMUSCULAR | Status: AC
  Administered 2019-12-23: 25 ug via INTRAVENOUS
  Filled 2019-12-23: qty 2

## 2019-12-23 MED ORDER — GLYCOPYRROLATE 0.2 MG/ML IJ SOLN
INTRAMUSCULAR | Status: DC | PRN
  Administered 2019-12-23: .2 mg via INTRAVENOUS

## 2019-12-23 MED ORDER — FAMOTIDINE 20 MG PO TABS
ORAL_TABLET | ORAL | Status: AC
  Administered 2019-12-23: 20 mg via ORAL
  Filled 2019-12-23: qty 1

## 2019-12-23 MED ORDER — FENTANYL CITRATE (PF) 100 MCG/2ML IJ SOLN
INTRAMUSCULAR | Status: AC
  Filled 2019-12-23: qty 2

## 2019-12-23 MED ORDER — SCOPOLAMINE 1 MG/3DAYS TD PT72
MEDICATED_PATCH | TRANSDERMAL | Status: AC
  Administered 2019-12-23: 1.5 mg via TRANSDERMAL
  Filled 2019-12-23: qty 1

## 2019-12-23 MED ORDER — ONDANSETRON HCL 4 MG/2ML IJ SOLN
INTRAMUSCULAR | Status: DC | PRN
  Administered 2019-12-23: 4 mg via INTRAVENOUS

## 2019-12-23 MED ORDER — KETOROLAC TROMETHAMINE 30 MG/ML IJ SOLN
INTRAMUSCULAR | Status: DC | PRN
  Administered 2019-12-23: 30 mg via INTRAVENOUS

## 2019-12-23 MED ORDER — MIDAZOLAM HCL 2 MG/2ML IJ SOLN
INTRAMUSCULAR | Status: DC | PRN
  Administered 2019-12-23: 2 mg via INTRAVENOUS

## 2019-12-23 MED ORDER — ONDANSETRON HCL 4 MG/2ML IJ SOLN: 4.0000 mg | Freq: Once | INTRAMUSCULAR | Status: DC | PRN

## 2019-12-23 MED ORDER — LIDOCAINE HCL (CARDIAC) PF 100 MG/5ML IV SOSY
PREFILLED_SYRINGE | INTRAVENOUS | Status: DC | PRN
  Administered 2019-12-23: 80 mg via INTRAVENOUS

## 2019-12-23 MED ORDER — DEXAMETHASONE SODIUM PHOSPHATE 10 MG/ML IJ SOLN
INTRAMUSCULAR | Status: DC | PRN
  Administered 2019-12-23: 10 mg via INTRAVENOUS

## 2019-12-23 MED ORDER — SCOPOLAMINE 1 MG/3DAYS TD PT72: 1.0000 | MEDICATED_PATCH | TRANSDERMAL | Status: DC

## 2019-12-23 SURGICAL SUPPLY — 19 items
COVER WAND RF STERILE (DRAPES) ×3 IMPLANT
DEVICE MYOSURE LITE (MISCELLANEOUS) IMPLANT
DEVICE MYOSURE REACH (MISCELLANEOUS) IMPLANT
ELECT REM PT RETURN 9FT ADLT (ELECTROSURGICAL) ×3
ELECTRODE REM PT RTRN 9FT ADLT (ELECTROSURGICAL) ×1 IMPLANT
GLOVE PI ORTHOPRO 6.5 (GLOVE) ×2
GLOVE PI ORTHOPRO STRL 6.5 (GLOVE) ×1 IMPLANT
GLOVE SURG SYN 6.5 ES PF (GLOVE) ×6 IMPLANT
GOWN STRL REUS W/ TWL LRG LVL3 (GOWN DISPOSABLE) ×2 IMPLANT
GOWN STRL REUS W/TWL LRG LVL3 (GOWN DISPOSABLE) ×4
IV LACTATED RINGERS 1000ML (IV SOLUTION) ×3 IMPLANT
KIT PROCEDURE FLUENT (KITS) IMPLANT
KIT TURNOVER CYSTO (KITS) ×3 IMPLANT
PACK DNC HYST (MISCELLANEOUS) ×3 IMPLANT
PAD OB MATERNITY 4.3X12.25 (PERSONAL CARE ITEMS) ×3 IMPLANT
PAD PREP 24X41 OB/GYN DISP (PERSONAL CARE ITEMS) ×3 IMPLANT
SEAL ROD LENS SCOPE MYOSURE (ABLATOR) ×3 IMPLANT
TUBING CONNECTING 10 (TUBING) ×2 IMPLANT
TUBING CONNECTING 10' (TUBING) ×1

## 2019-12-23 NOTE — Transfer of Care (Signed)
Immediate Anesthesia Transfer of Care Note  Patient: Leslie Bowman  Procedure(s) Performed: DILATATION & CURETTAGE/HYSTEROSCOPY WITH MYOSURE (N/A )  Patient Location: PACU  Anesthesia Type:General  Level of Consciousness: sedated  Airway & Oxygen Therapy: Patient Spontanous Breathing and Patient connected to face mask oxygen  Post-op Assessment: Report given to RN and Post -op Vital signs reviewed and stable  Post vital signs: Reviewed and stable  Last Vitals:  Vitals Value Taken Time  BP    Temp    Pulse 101 12/23/19 1359  Resp 17 12/23/19 1359  SpO2 100 % 12/23/19 1359  Vitals shown include unvalidated device data.  Last Pain:  Vitals:   12/23/19 1352  TempSrc:   PainSc: 0-No pain         Complications: No apparent anesthesia complications

## 2019-12-23 NOTE — Anesthesia Postprocedure Evaluation (Signed)
Anesthesia Post Note  Patient: Leslie Bowman  Procedure(s) Performed: DILATATION & CURETTAGE/HYSTEROSCOPY WITH MYOSURE (N/A )  Patient location during evaluation: PACU Anesthesia Type: General Level of consciousness: awake and alert and oriented Pain management: pain level controlled Vital Signs Assessment: post-procedure vital signs reviewed and stable Respiratory status: spontaneous breathing, nonlabored ventilation and respiratory function stable Cardiovascular status: blood pressure returned to baseline and stable Postop Assessment: no signs of nausea or vomiting Anesthetic complications: no     Last Vitals:  Vitals:   12/23/19 1437 12/23/19 1439  BP:    Pulse: 67 63  Resp: 14 14  Temp:    SpO2: 100% 98%    Last Pain:  Vitals:   12/23/19 1439  TempSrc:   PainSc: 0-No pain                 [name]

## 2019-12-23 NOTE — Anesthesia Preprocedure Evaluation (Signed)
Anesthesia Evaluation  Patient identified by MRN, date of birth, ID band Patient awake    Reviewed: Allergy & Precautions, NPO status , Patient's Chart, lab work & pertinent test results  Airway Mallampati: II       Dental   Pulmonary neg pulmonary ROS,    Pulmonary exam normal        Cardiovascular Normal cardiovascular exam     Neuro/Psych negative neurological ROS  negative psych ROS   GI/Hepatic negative GI ROS, Neg liver ROS,   Endo/Other  negative endocrine ROS  Renal/GU negative Renal ROS  Female GU complaint     Musculoskeletal negative musculoskeletal ROS (+)   Abdominal Normal abdominal exam  (+)   Peds negative pediatric ROS (+)  Hematology  (+) anemia ,   Anesthesia Other Findings Past Medical History: No date: Anemia No date: Cancer ([facility])     Comment:  skin ca  Reproductive/Obstetrics                             Anesthesia Physical Anesthesia Plan  ASA: II  Anesthesia Plan: General   Post-op Pain Management:    Induction: Intravenous  PONV Risk Score and Plan:   Airway Management Planned: LMA  Additional Equipment:   Intra-op Plan:   Post-operative Plan: Extubation in OR  Informed Consent: I have reviewed the patients History and Physical, chart, labs and discussed the procedure including the risks, benefits and alternatives for the proposed anesthesia with the patient or authorized representative who has indicated his/her understanding and acceptance.     Dental advisory given  Plan Discussed with: CRNA and Surgeon  Anesthesia Plan Comments:         Anesthesia Quick Evaluation

## 2019-12-23 NOTE — Op Note (Signed)
Operative Report Hysteroscopy, Dilation and Curettage 12/23/2019  Patient:  Leslie Bowman  44 y.o. female Preoperative diagnosis:  menorrhagia, thickened endometrium Postoperative diagnosis:  menorrhagia, thickened endometrium  PROCEDURE:  Procedure(s): DILATATION & CURETTAGE/HYSTEROSCOPY WITH MYOSURE (N/A) Surgeon:  Surgeon(s) and Role:    * Rodrigues Urbanek, Honor Loh, MD - Primary Anesthesia:  LMA I/O: Total I/O In: 700 [I.V.:700] Out: 1 [Blood:1] Specimens:  Endometrial curettings Complications: None Apparent Disposition:  VS stable to PACU  Findings: Uterus, mobile, enlarged size, sounding to 12 cm; normal cervix, vagina, perineum.  Fluffy endometrium diffusely and possible posterior sessile polyp.    Indication for procedure/Consents: 44 y.o. here for scheduled surgery for the aforementioned diagnoses.  Risks of surgery were discussed with the patient including but not limited to: bleeding which may require transfusion; infection which may require antibiotics; injury to uterus or surrounding organs; intrauterine scarring which may impair future fertility; need for additional procedures including laparotomy or laparoscopy; and other postoperative/anesthesia complications. Written informed consent was obtained.    Procedure Details:   The patient was then taken to the operating room where anesthesia was administered and was found to be adequate.  After a formal timeout was performed, she was placed in the dorsal lithotomy position and examined with the above findings. She was then prepped and draped in the sterile manner.  A speculum was then placed in the patient's vagina and a single tooth tenaculum was applied to the anterior lip of the cervix.  A paracervical block was placed.  The uterus was sounded to 12cm. Her cervix was serially dilated to accommodate the myoscope, with findings as above. A sharp curettage was then performed until there was a gritty texture in all four quadrants. The  specimen was handed off to nursing.  The camera was reinserted and confirmed the uterus had been evacuated. The tenaculum was removed from the anterior lip of the cervix, bleeding points cauterized with silver nitrate, and the vaginal speculum was removed after noting good hemostasis. The patient tolerated the procedure well and was taken to the recovery area awake, extubated and in stable condition.  The patient will be discharged to home as per PACU criteria.  Routine postoperative instructions given. She will follow up in the clinic in two to four weeks for postoperative evaluation.  Larey Days, MD [facility] OBGYN Attending Gynecologist

## 2019-12-23 NOTE — Discharge Instructions (Signed)
You should expect to have some cramping and vaginal bleeding for about a week. This should taper off and subside, much like a period. If heavy bleeding continues or gets worse, you should contact the office for an earlier appointment.   Please call the office or physician on call for fever >101, severe pain, and heavy bleeding.   [place]!!  Dr. Leonides Schanz will discuss pathology results with you at your postop visit.   AMBULATORY SURGERY  DISCHARGE INSTRUCTIONS  1) The drugs that you were given will stay in your system until tomorrow so for the next 24 hours you should not: A) Drive an automobile B) Make any legal decisions C) Drink any alcoholic beverage  2) You may resume regular meals tomorrow.  Today it is better to start with liquids and gradually work up to solid foods. You may eat anything you prefer, but it is better to start with liquids, then soup and crackers, and gradually work up to solid foods.  3) Please notify your doctor immediately if you have any unusual bleeding, trouble breathing, redness and pain at the surgery site, drainage, fever, or pain not relieved by medication.  Please contact your physician with any problems or Same Day Surgery at [phone], Monday through Friday 6 am to 4 pm, or [place] at [facility] number at [phone].

## 2019-12-23 NOTE — Anesthesia Procedure Notes (Signed)
Procedure Name: LMA Insertion Date/Time: 12/23/2019 1:00 PM Performed by: Justus Memory, CRNA Pre-anesthesia Checklist: Patient identified, Patient being monitored, Timeout performed, Emergency Drugs available and Suction available Patient Re-evaluated:Patient Re-evaluated prior to induction Oxygen Delivery Method: Circle system utilized Preoxygenation: Pre-oxygenation with 100% oxygen Induction Type: IV induction Ventilation: Mask ventilation without difficulty LMA: LMA inserted LMA Size: 3.5 Tube type: Oral Number of attempts: 1 Placement Confirmation: positive ETCO2 and breath sounds checked- equal and bilateral Tube secured with: Tape Dental Injury: Teeth and Oropharynx as per pre-operative assessment

## 2019-12-23 NOTE — H&P (Signed)
Preoperative History and Physical  Leslie Bowman is a 44 y.o.  here for surgical management of menometrorrhagia, with thickened endometrium.   No significant preoperative concerns.  Proposed surgery:  dilation curettage, hysteroscopy  Past Medical History:  Diagnosis Date  . Anemia   . Cancer ([employer])    skin ca   Past Surgical History:  Procedure Laterality Date  . TUBAL LIGATION     OB History  No obstetric history on file.  Patient denies any other pertinent gynecologic issues.   No current facility-administered medications on file prior to encounter.   Current Outpatient Medications on File Prior to Encounter  Medication Sig Dispense Refill  . naproxen sodium (ALEVE) 220 MG tablet Take 220-440 mg by mouth 2 (two) times daily as needed (pain.).     No Known Allergies  Social History:   reports that she has never smoked. She has never used smokeless tobacco. She reports that she does not drink alcohol or use drugs.  Family History  Problem Relation Age of Onset  . Breast cancer Neg Hx     Review of Systems: Noncontributory  PHYSICAL EXAM: Blood pressure 114/90, pulse 70, temperature 97.6 F (36.4 C), temperature source Temporal, resp. rate 16, last menstrual period 12/11/2019, SpO2 100 %. General appearance - alert, well appearing, and in no distress Chest - clear to auscultation, no wheezes, rales or rhonchi, symmetric air entry Heart - normal rate and regular rhythm Abdomen - soft, nontender, nondistended, no masses or organomegaly Pelvic - examination not indicated Extremities - peripheral pulses normal, no pedal edema, no clubbing or cyanosis  Labs: Results for orders placed or performed during the hospital encounter of 12/23/19 (from the past 336 hour(s))  ABO/Rh   Collection Time: 12/23/19  9:28 AM  Result Value Ref Range   ABO/RH(D)      A POS Performed at [facility], [address] [place], [place] [address]   Pregnancy, urine POC   Collection Time: 12/23/19  9:31 AM  Result Value Ref Range   Preg Test, Ur NEGATIVE NEGATIVE  Results for orders placed or performed during the hospital encounter of 12/19/19 (from the past 336 hour(s))  SARS CORONAVIRUS 2 (TAT 6-24 HRS) Nasopharyngeal Nasopharyngeal Swab   Collection Time: 12/19/19  8:22 AM   Specimen: Nasopharyngeal Swab  Result Value Ref Range   SARS Coronavirus 2 NEGATIVE NEGATIVE  Basic metabolic panel   Collection Time: 12/19/19  8:22 AM  Result Value Ref Range   Sodium 135 135 - 145 mmol/L   Potassium 3.9 3.5 - 5.1 mmol/L   Chloride 103 98 - 111 mmol/L   CO2 25 22 - 32 mmol/L   Glucose, Bld 97 70 - 99 mg/dL   BUN 20 6 - 20 mg/dL   Creatinine, Ser 0.82 0.44 - 1.00 mg/dL   Calcium 8.4 (L) 8.9 - 10.3 mg/dL   GFR calc non Af Amer >60 >60 mL/min   GFR calc Af Amer >60 >60 mL/min   Anion gap 7 5 - 15  CBC   Collection Time: 12/19/19  8:22 AM  Result Value Ref Range   WBC 5.7 4.0 - 10.5 K/uL   RBC 4.30 3.87 - 5.11 MIL/uL   Hemoglobin 10.1 (L) 12.0 - 15.0 g/dL   HCT 34.0 (L) 36.0 - 46.0 %   MCV 79.1 (L) 80.0 - 100.0 fL   MCH 23.5 (L) 26.0 - 34.0 pg   MCHC 29.7 (L) 30.0 - 36.0 g/dL   RDW 14.2 11.5 -  15.5 %   Platelets 371 150 - 400 K/uL   nRBC 0.0 0.0 - 0.2 %  Type and screen [place]   Collection Time: 12/19/19  8:22 AM  Result Value Ref Range   ABO/RH(D) A POS    Antibody Screen NEG    Sample Expiration 01/02/2020,2359    Extend sample reason      NO TRANSFUSIONS OR PREGNANCY IN THE PAST 3 MONTHS Performed at [facility], [address] [place], [place] [address]     Imaging Studies: No results found.  Assessment: There are no problems to display for this patient.   Plan: Patient will undergo surgical management with dilation curettage, hysteroscopy.   The risks of surgery were discussed in detail with the patient including but not limited to: bleeding which may require transfusion or reoperation; infection  which may require antibiotics; injury to surrounding organs which may involve bowel, bladder, ureters ; need for additional procedures including laparoscopy or laparotomy; thromboembolic phenomenon, surgical site problems and other postoperative/anesthesia complications. Likelihood of success in alleviating the patient's condition was discussed. Routine postoperative instructions will be reviewed with the patient and her family in detail after surgery.  The patient concurred with the proposed plan, giving informed written consent for the surgery.  Patient has been NPO since last night she will remain NPO for procedure.  Anesthesia and OR aware.   To OR when ready.  ----- Larey Days, MD, [place] Attending Obstetrician and Gynecologist [facility], Department of [facility] . 12/23/2019 10:51 AM

## 2019-12-24 LAB — SURGICAL PATHOLOGY
